# Patient Record
Sex: Male | Born: 2000
Health system: Southern US, Community
[De-identification: ages and names within clinical notes are randomized; demographics above are authoritative.]

## PROBLEM LIST (undated history)

## (undated) DIAGNOSIS — J45909 Unspecified asthma, uncomplicated: Secondary | ICD-10-CM

## (undated) HISTORY — DX: Unspecified asthma, uncomplicated: J45.909

---
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
## 2019-04-25 IMAGING — US US EXTREM  UP VENOUS*L*
1 series · 13 of 24 positions shown · non-contrast
Comparison: None.

CLINICAL DATA: Superficial thrombophlebitis. Left upper extremity
pain, swelling



[Series 1: us extrem up venous*left* · 0.06mm/px · 13 of 39 slices shown]
[im 1/39]
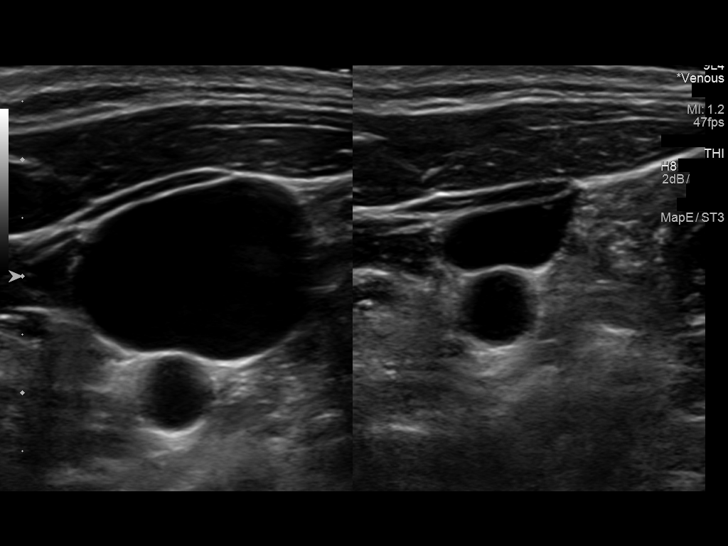
[im 4/39]
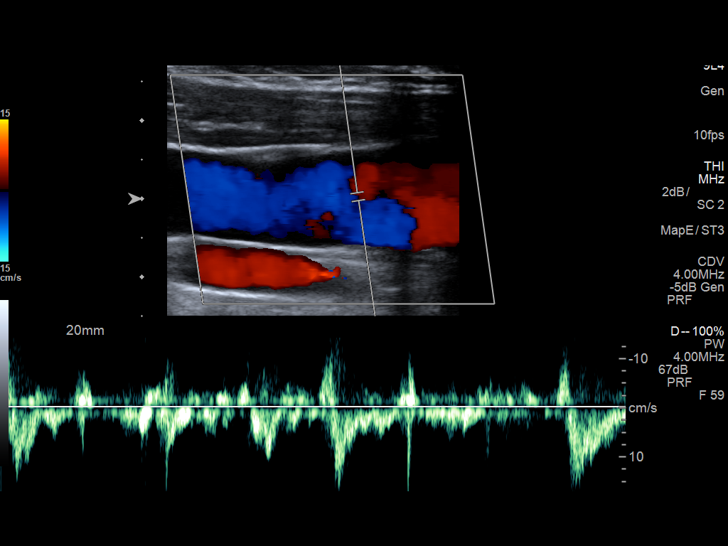
[im 7/39]
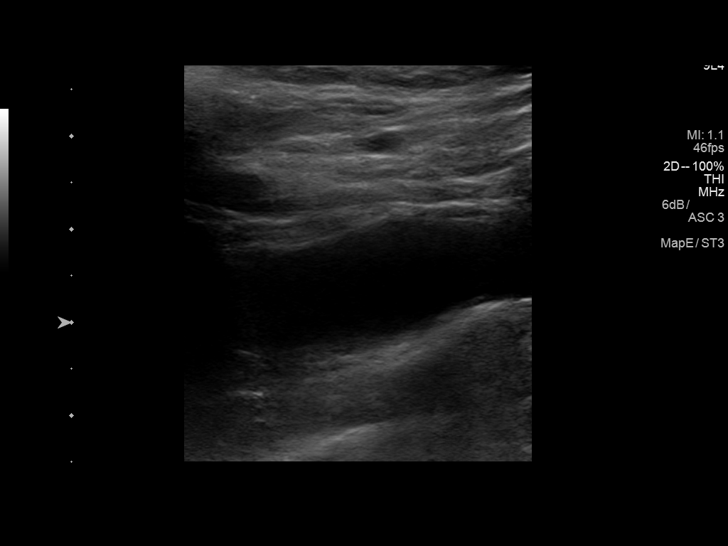
[im 10/39]
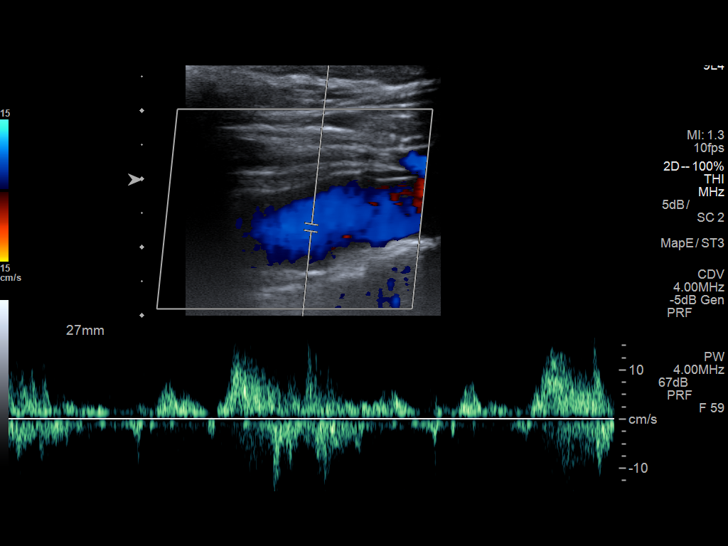
[im 14/39]
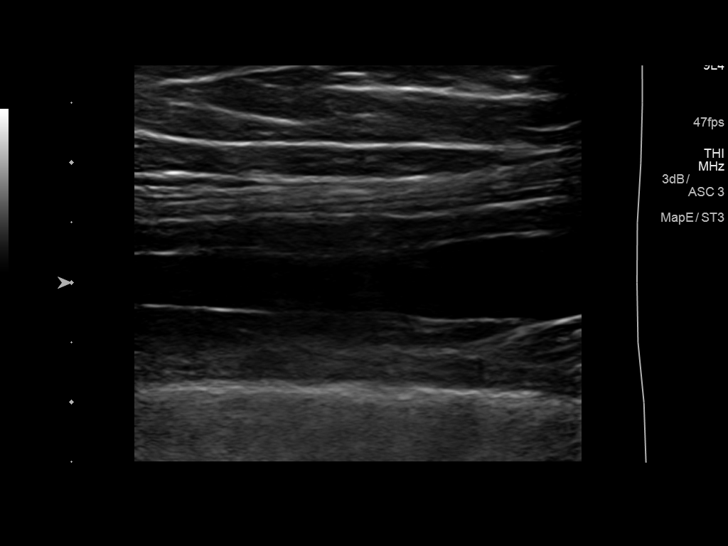
[im 17/39]
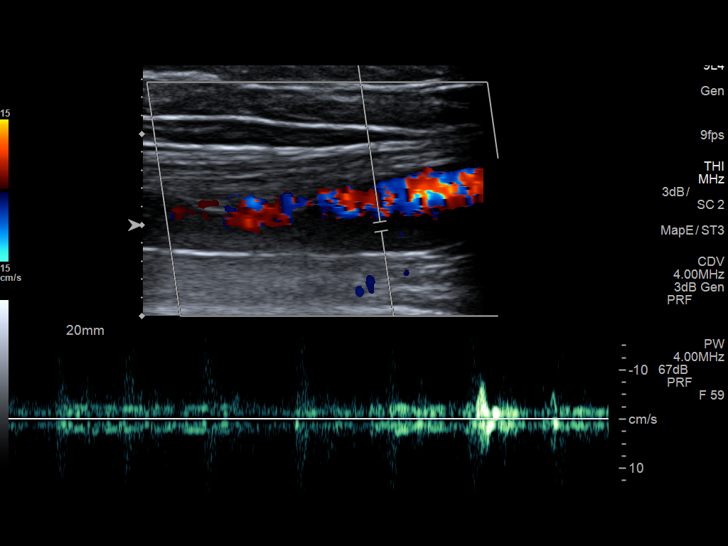
[im 20/39]
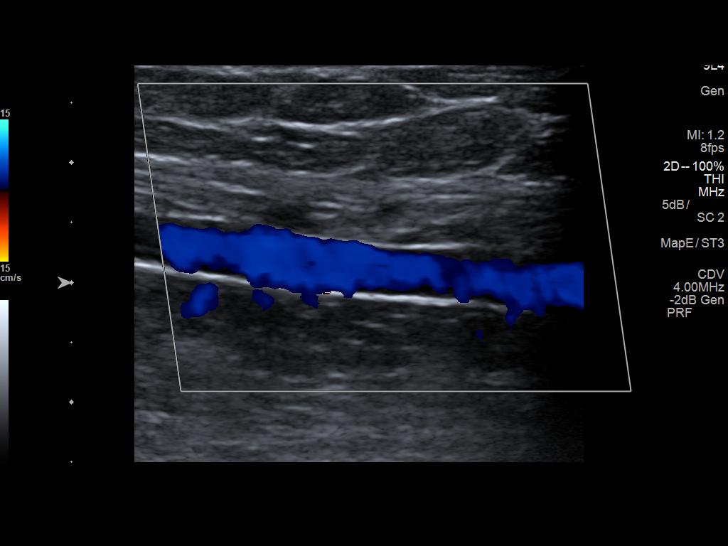
[im 22/39]
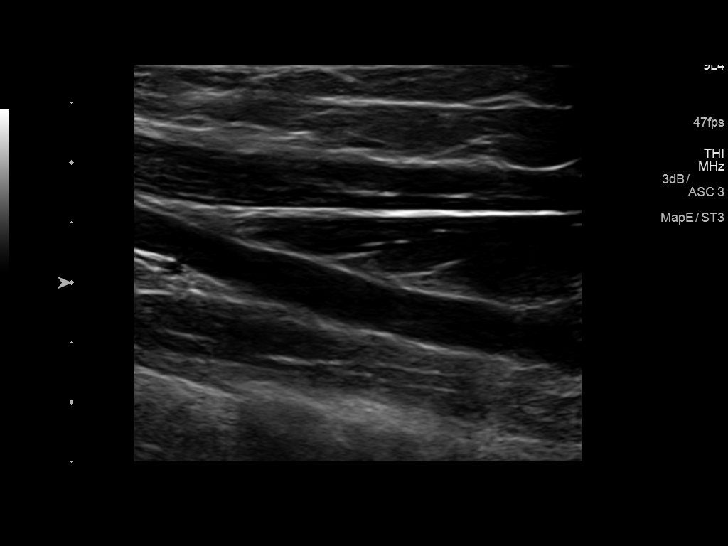
[im 25/39]
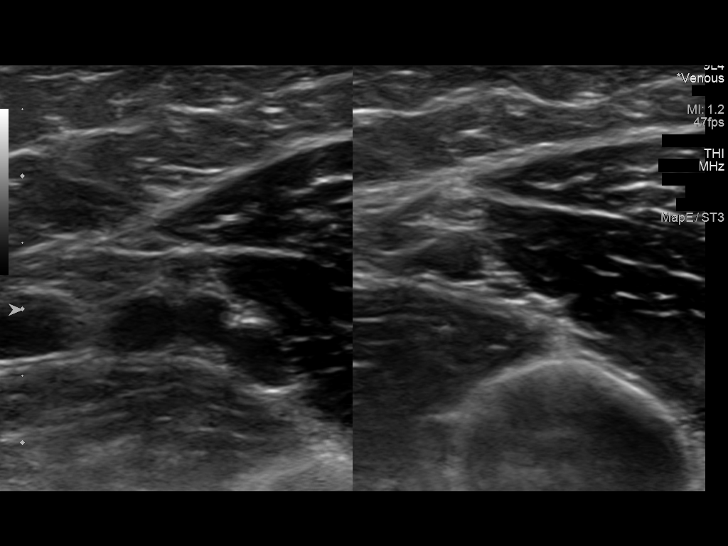
[im 29/39]
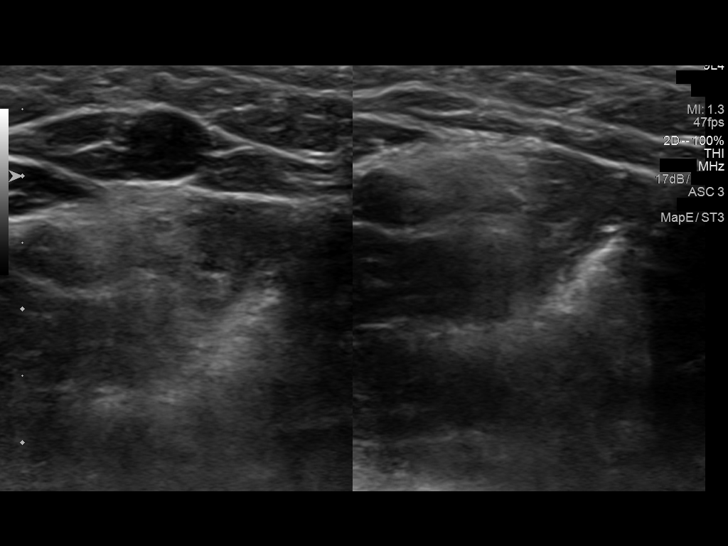
[im 32/39]
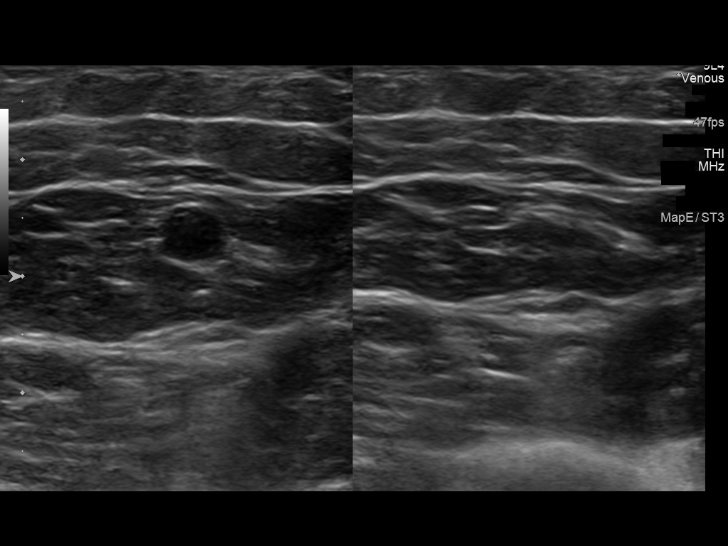
[im 35/39]
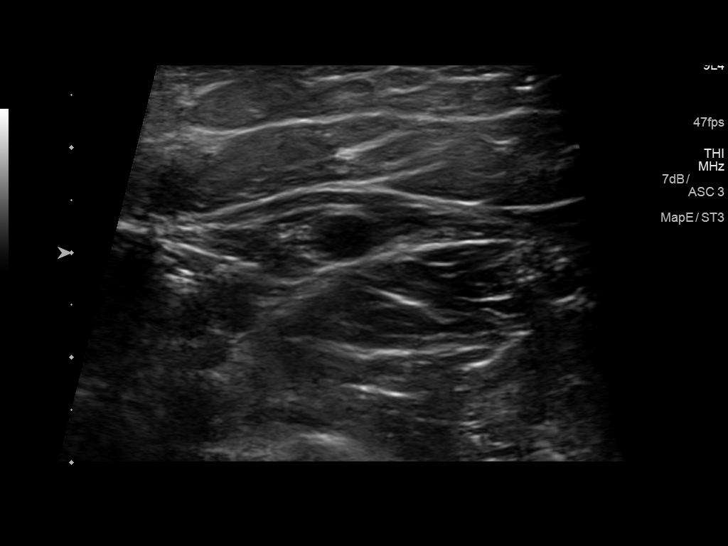
[im 39/39]
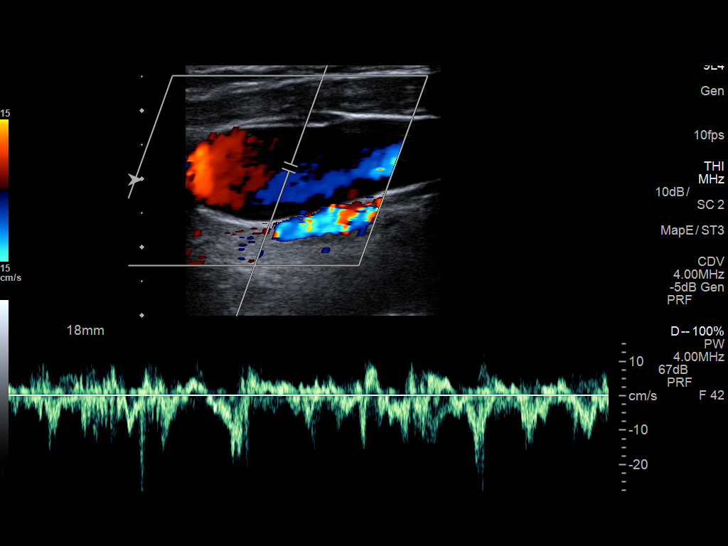

[13 of 24 positions shown; findings below may reference images not displayed]

FINDINGS: Contralateral Subclavian Vein: Respiratory phasicity is normal and
symmetric with the symptomatic side. No evidence of thrombus. Normal
compressibility.

Internal Jugular Vein: No evidence of thrombus. Normal
compressibility, respiratory phasicity and response to augmentation.

Subclavian Vein: No evidence of thrombus. Normal compressibility,
respiratory phasicity and response to augmentation.

Axillary Vein: No evidence of thrombus. Normal compressibility,
respiratory phasicity and response to augmentation.

Cephalic Vein: No evidence of thrombus. Normal compressibility,
respiratory phasicity and response to augmentation.

Basilic Vein: No evidence of thrombus. Normal compressibility,
respiratory phasicity and response to augmentation.

Brachial Veins: No evidence of thrombus. Normal compressibility,
respiratory phasicity and response to augmentation.

Radial Veins: No evidence of thrombus. Normal compressibility,
respiratory phasicity and response to augmentation.

Ulnar Veins: No evidence of thrombus. Normal compressibility,
respiratory phasicity and response to augmentation.

Venous Reflux:  None visualized.

Other Findings: No abnormality noted in the medial left upper arm in
the area of palpable lump.
IMPRESSION: No evidence of DVT within the left upper extremity.

## 2022-02-26 ENCOUNTER — Other Ambulatory Visit: Payer: Self-pay

## 2022-02-26 ENCOUNTER — Emergency Department (HOSPITAL_BASED_OUTPATIENT_CLINIC_OR_DEPARTMENT_OTHER)
Admission: EM | Admit: 2022-02-26 | Discharge: 2022-02-26 | Disposition: A | Discharge status: Home / Self Care | Payer: 59 | Attending: Emergency Medicine | Admitting: Emergency Medicine

## 2022-02-26 ENCOUNTER — Encounter (HOSPITAL_BASED_OUTPATIENT_CLINIC_OR_DEPARTMENT_OTHER): Payer: Self-pay | Admitting: Obstetrics and Gynecology

## 2022-02-26 DIAGNOSIS — J45909 Unspecified asthma, uncomplicated: Secondary | ICD-10-CM | POA: Diagnosis not present

## 2022-02-26 DIAGNOSIS — S61412A Laceration without foreign body of left hand, initial encounter: Secondary | ICD-10-CM | POA: Diagnosis not present

## 2022-02-26 DIAGNOSIS — W268XXA Contact with other sharp object(s), not elsewhere classified, initial encounter: Secondary | ICD-10-CM | POA: Insufficient documentation

## 2022-02-26 DIAGNOSIS — S6992XA Unspecified injury of left wrist, hand and finger(s), initial encounter: Secondary | ICD-10-CM | POA: Diagnosis not present

## 2022-02-26 MED ORDER — LIDOCAINE HCL (PF) 1 % IJ SOLN
10.0000 mL | Freq: Once | INTRAMUSCULAR | Status: AC
  Administered 2022-02-26: 10 mL
  Filled 2022-02-26: qty 10

## 2022-02-26 NOTE — ED Triage Notes (Signed)
Patient presents to the ER for a laceration to the right palm of the hand. Patient had a tetanus shot within the last 6 months.  ?

## 2022-02-26 NOTE — ED Provider Notes (Signed)
?[facility]PT ?Provider Note ? ? ?CSN: [id number] ?Arrival date & time: 02/26/22  1447 ? ?  ? ?History ? ?Chief Complaint  ?Patient presents with  ? Laceration  ? ? ?Scott Rubio is a 21 y.o. male with past medical history of asthma.  Presents to the emergency department with complaint of laceration to right palm.  Patient states that laceration occurred accidentally at approximately 2 PM this afternoon.  States that he was coming down a ladder at work when he cut his palm on a piece of metal attached to an air filter.  Patient reports bleeding was controlled with direct pressure.  Patient endorses minimal pain to laceration site.  Denies any numbness, weakness, color change. ? ?Patient is right-hand dominant.  Last tetanus shot within the past 6 months. ? ? ?Laceration ?Associated symptoms: no fever and no rash   ? ?  ? ?Home Medications ?Prior to Admission medications   ?Medication Sig Start Date End Date Taking? Authorizing Provider  ?sulfamethoxazole-trimethoprim (BACTRIM DS) 800-160 MG tablet Take 1 tablet by mouth 2 (two) times daily. 12/19/18   Dettinger, Elige RadonJoshua A, MD  ?sulfamethoxazole-trimethoprim (BACTRIM DS,SEPTRA DS) 800-160 MG tablet TAKE  (1)  TABLET TWICE A DAY. 12/20/18   Junie SpencerHawks, Christy A, FNP  ?   ? ?Allergies    ?Amoxicillin and Penicillins   ? ?Review of Systems   ?Review of Systems  ?Constitutional:  Negative for chills and fever.  ?Musculoskeletal:  Positive for myalgias. Negative for arthralgias.  ?Skin:  Positive for wound. Negative for color change, pallor and rash.  ?Neurological:  Negative for weakness and numbness.  ? ?Physical Exam ?Updated Vital Signs ?BP (!) 148/95 (BP Location: Left Arm)   Pulse 86   Temp 99 ?F (37.2 ?C)   Resp 16   SpO2 97%  ?Physical Exam ?Vitals and nursing note reviewed.  ?Constitutional:   ?   General: He is not in acute distress. ?   Appearance: He is not ill-appearing, toxic-appearing or diaphoretic.  ?HENT:  ?   Head: Normocephalic.   ?Eyes:  ?   General: No scleral icterus.    ?   Right eye: No discharge.     ?   Left eye: No discharge.  ?Cardiovascular:  ?   Rate and Rhythm: Normal rate.  ?   Pulses:     ?     Dorsalis pedis pulses are 2+ on the right side and 2+ on the left side.  ?Pulmonary:  ?   Effort: Pulmonary effort is normal.  ?Musculoskeletal:  ?   Right forearm: Normal.  ?   Left forearm: Normal.  ?   Right wrist: Normal.  ?   Left wrist: Normal.  ?   Right hand: Laceration present. No swelling, deformity, tenderness or bony tenderness. Normal range of motion. Normal sensation. Normal capillary refill. Normal pulse.  ?   Left hand: No swelling, deformity, lacerations, tenderness or bony tenderness. Normal range of motion. Normal sensation. Normal capillary refill. Normal pulse.  ?     Arms: ? ?   Comments: Patient has full range of motion to all digits of right hand including abduction and abduction of right thumb. ? ?1 Centimeter laceration to palm of right hand as indicated above  ?Skin: ?   General: Skin is warm and dry.  ?Neurological:  ?   General: No focal deficit present.  ?   Mental Status: He is alert.  ?Psychiatric:     ?  Behavior: Behavior is cooperative.  ? ? ?ED Results / Procedures / Treatments   ?Labs ?(all labs ordered are listed, but only abnormal results are displayed) ?Labs Reviewed - No data to display ? ?EKG ?None ? ?Radiology ?No results found. ? ?Procedures ?Marland Kitchen.Laceration Repair ? ?Date/Time: 02/26/2022 11:52 PM ?Performed by: Haskel Schroeder, PA-C ?Authorized by: Haskel Schroeder, PA-C  ? ?Consent:  ?  Consent obtained:  Verbal ?  Consent given by:  Patient ?  Risks discussed:  Infection, need for additional repair, pain, poor cosmetic result and poor wound healing ?  Alternatives discussed:  No treatment and delayed treatment ?Universal protocol:  ?  Procedure explained and questions answered to patient or proxy's satisfaction: yes   ?  Relevant documents present and verified: yes   ?  Immediately  prior to procedure, a time out was called: yes   ?  Patient identity confirmed:  Verbally with patient and arm band ?Anesthesia:  ?  Anesthesia method:  Local infiltration ?  Local anesthetic:  Lidocaine 1% w/o epi ?Laceration details:  ?  Location:  Hand ?  Hand location:  L palm ?  Length (cm):  1 ?Pre-procedure details:  ?  Preparation:  Patient was prepped and draped in usual sterile fashion ?Exploration:  ?  Wound exploration: entire depth of wound visualized   ?  Wound extent: no foreign bodies/material noted   ?Treatment:  ?  Area cleansed with:  Povidone-iodine ?  Irrigation solution:  Sterile saline ?  Irrigation method:  Syringe ?Skin repair:  ?  Repair method:  Sutures ?  Suture size:  3-0 ?  Suture material:  Prolene ?  Suture technique:  Simple interrupted ?  Number of sutures:  4 ?Repair type:  ?  Repair type:  Simple ?Post-procedure details:  ?  Dressing:  Sterile dressing ?  Procedure completion:  Tolerated well, no immediate complications  ? ? ?Medications Ordered in ED ?Medications  ?lidocaine (PF) (XYLOCAINE) 1 % injection 10 mL (10 mLs Infiltration Given 02/26/22 1701)  ? ? ?ED Course/ Medical Decision Making/ A&P ?  ?                        ?Medical Decision Making ?Risk ?Prescription drug management. ? ? ?Alert 21 year old male no acute distress, nontoxic-appearing.  Presents emergency department complaint of laceration to right palm. ? ?Information is obtained from patient and patient's brother at bedside.  Past medical records were reviewed including previous provider notes. ? ?Patient is right-hand dominant.  Tetanus shot within the last 6 months.  Patient has full range of motion to all digits of right hand including abduction and abduction of right thumb.  Low suspicion for tendon injury.  Cap refill less than 2 seconds and sensation intact to all digits of left hand. ? ?Shared decision making with patient on suture procedure versus no treatment.  Patient elects for procedure at this time.   Entire depth of wound was evaluated in clear and bloodless field.  No foreign bodies appreciated.  Suture procedure as noted above. ? ?Patient will need to have sutures removed in 10 to 14 days. ? ?Discussed results, findings, treatment and follow up. Patient advised of return precautions. Patient verbalized understanding and agreed with plan. ? ? ? ? ? ? ? ? ?Final Clinical Impression(s) / ED Diagnoses ?Final diagnoses:  ?Laceration of left palm, initial encounter  ? ? ?Rx / DC Orders ?ED Discharge Orders   ? ?  None  ? ?  ? ? ?  ?Haskel Schroeder, PA-C ?02/26/22 2354 ? ?  ?Terald Sleeper, MD ?02/27/22 [phone] ? ?

## 2022-02-26 NOTE — Discharge Instructions (Addendum)
Your laceration required stiches.  Please follow up with your primary care provider, urgent care or return to the emergency department in 10-14 days to have your stitches removed.   ° °Please keep the wound dry for the next 24 hours.  After that you may gently wash the area with soap and water.  Do not submerge the wound under water until the stiches are removed.   ° °Get help right away if: °You develop severe swelling around the wound. °Your pain suddenly increases and is severe. °You develop painful lumps near the wound or on skin anywhere else on your body. °You have a red streak going away from your wound. °The wound is on your hand or foot, and you cannot properly move a finger or toe. °The wound is on your hand or foot, and you notice that your fingers or toes look pale or bluish. °

## 2022-03-12 ENCOUNTER — Encounter (HOSPITAL_COMMUNITY): Payer: Self-pay

## 2022-03-12 ENCOUNTER — Ambulatory Visit (INDEPENDENT_AMBULATORY_CARE_PROVIDER_SITE_OTHER): Payer: 59

## 2022-03-12 ENCOUNTER — Emergency Department (HOSPITAL_COMMUNITY)
Admission: EM | Admit: 2022-03-12 | Discharge: 2022-03-12 | Disposition: A | Discharge status: Home / Self Care | Payer: 59 | Attending: Emergency Medicine | Admitting: Emergency Medicine

## 2022-03-12 ENCOUNTER — Ambulatory Visit
Admission: EM | Admit: 2022-03-12 | Discharge: 2022-03-12 | Disposition: A | Discharge status: Home / Self Care | Payer: 59 | Attending: Urgent Care | Admitting: Urgent Care

## 2022-03-12 ENCOUNTER — Encounter: Payer: Self-pay | Admitting: Emergency Medicine

## 2022-03-12 DIAGNOSIS — Z4801 Encounter for change or removal of surgical wound dressing: Secondary | ICD-10-CM | POA: Insufficient documentation

## 2022-03-12 DIAGNOSIS — L03113 Cellulitis of right upper limb: Secondary | ICD-10-CM

## 2022-03-12 DIAGNOSIS — T148XXA Other injury of unspecified body region, initial encounter: Secondary | ICD-10-CM | POA: Diagnosis not present

## 2022-03-12 DIAGNOSIS — L089 Local infection of the skin and subcutaneous tissue, unspecified: Secondary | ICD-10-CM | POA: Diagnosis not present

## 2022-03-12 DIAGNOSIS — T8149XA Infection following a procedure, other surgical site, initial encounter: Secondary | ICD-10-CM | POA: Diagnosis not present

## 2022-03-12 DIAGNOSIS — S61411A Laceration without foreign body of right hand, initial encounter: Secondary | ICD-10-CM

## 2022-03-12 LAB — CBC WITH DIFFERENTIAL/PLATELET
Abs Immature Granulocytes: 0.03 10*3/uL (ref 0.00–0.07)
Basophils Absolute: 0 10*3/uL (ref 0.0–0.1)
Basophils Relative: 0 %
Eosinophils Absolute: 0.1 10*3/uL (ref 0.0–0.5)
Eosinophils Relative: 1 %
HCT: 46.7 % (ref 39.0–52.0)
Hemoglobin: 15.9 g/dL (ref 13.0–17.0)
Immature Granulocytes: 0 %
Lymphocytes Relative: 18 %
Lymphs Abs: 1.7 10*3/uL (ref 0.7–4.0)
MCH: 31.2 pg (ref 26.0–34.0)
MCHC: 34 g/dL (ref 30.0–36.0)
MCV: 91.6 fL (ref 80.0–100.0)
Monocytes Absolute: 0.8 10*3/uL (ref 0.1–1.0)
Monocytes Relative: 8 %
Neutro Abs: 7.1 10*3/uL (ref 1.7–7.7)
Neutrophils Relative %: 73 %
Platelets: 229 10*3/uL (ref 150–400)
RBC: 5.1 MIL/uL (ref 4.22–5.81)
RDW: 12.3 % (ref 11.5–15.5)
WBC: 9.7 10*3/uL (ref 4.0–10.5)
nRBC: 0 % (ref 0.0–0.2)

## 2022-03-12 LAB — BASIC METABOLIC PANEL
Anion gap: 7 (ref 5–15)
BUN: 13 mg/dL (ref 6–20)
CO2: 24 mmol/L (ref 22–32)
Calcium: 9.6 mg/dL (ref 8.9–10.3)
Chloride: 108 mmol/L (ref 98–111)
Creatinine, Ser: 0.82 mg/dL (ref 0.61–1.24)
GFR, Estimated: >60 mL/min (ref >60)
Glucose, Bld: 94 mg/dL (ref 70–99)
Potassium: 3.9 mmol/L (ref 3.5–5.1)
Sodium: 139 mmol/L (ref 135–145)

## 2022-03-12 LAB — LACTIC ACID, PLASMA: Lactic Acid, Venous: 0.9 mmol/L (ref 0.5–1.9)

## 2022-03-12 MED ORDER — DOXYCYCLINE HYCLATE 100 MG PO CAPS: 100.0000 mg | ORAL_CAPSULE | Freq: Two times a day (BID) | ORAL | 0 refills | Status: DC

## 2022-03-12 NOTE — ED Provider Notes (Addendum)
?[facility] ? ? ?MRN: [id number] DOB: 09-01-2001 ? ?Subjective:  ? ?Scott Rubio is a 21 y.o. male presenting for recheck on a wound from the right hand.  Injury was sustained 2 weeks ago.  He has a laceration repair at the emergency room.  Has tried to keep his hand clean and has practiced good precautionary measures including wearing protection over his hand while at work.  In the past 2 days he has had significant pain and redness, swelling.  No fever. ? ?No current facility-administered medications for this encounter. ? ?Current Outpatient Medications:  ?  sulfamethoxazole-trimethoprim (BACTRIM DS) 800-160 MG tablet, Take 1 tablet by mouth 2 (two) times daily., Disp: 14 tablet, Rfl: 0 ?  sulfamethoxazole-trimethoprim (BACTRIM DS,SEPTRA DS) 800-160 MG tablet, TAKE  (1)  TABLET TWICE A DAY., Disp: 14 tablet, Rfl: 0  ? ?Allergies  ?Allergen Reactions  ? Amoxicillin   ? Penicillins Rash  ? ? ?Past Medical History:  ?Diagnosis Date  ? Asthma   ?  ? ?History reviewed. No pertinent surgical history. ? ?Family History  ?Problem Relation Age of Onset  ? Heart disease Father   ? Hypertension Father   ? ? ?Social History  ? ?Tobacco Use  ? Smoking status: Never  ?  Passive exposure: Never  ? Smokeless tobacco: Never  ?Vaping Use  ? Vaping Use: Never used  ?Substance Use Topics  ? Alcohol use: No  ? Drug use: No  ? ? ?ROS ? ? ?Objective:  ? ?Vitals: ?BP 139/84 (BP Location: Right Arm)   Pulse 82   Temp 98.5 ?F (36.9 ?C) (Oral)   Resp 18   SpO2 94%  ? ?Physical Exam ?Constitutional:   ?   General: He is not in acute distress. ?   Appearance: Normal appearance. He is well-developed and normal weight. He is not ill-appearing, toxic-appearing or diaphoretic.  ?HENT:  ?   Head: Normocephalic and atraumatic.  ?   Right Ear: External ear normal.  ?   Left Ear: External ear normal.  ?   Nose: Nose normal.  ?   Mouth/Throat:  ?   Pharynx: Oropharynx is clear.  ?Eyes:  ?   General: No scleral icterus.    ?    Right eye: No discharge.     ?   Left eye: No discharge.  ?   Extraocular Movements: Extraocular movements intact.  ?Cardiovascular:  ?   Rate and Rhythm: Normal rate.  ?Pulmonary:  ?   Effort: Pulmonary effort is normal.  ?Musculoskeletal:  ?     Hands: ? ?   Cervical back: Normal range of motion.  ?Neurological:  ?   Mental Status: He is alert and oriented to person, place, and time.  ?Psychiatric:     ?   Mood and Affect: Mood normal.     ?   Behavior: Behavior normal.     ?   Thought Content: Thought content normal.     ?   Judgment: Judgment normal.  ? ? ? ? ?DG Hand Complete Right ? ?Result Date: 03/12/2022 ?CLINICAL DATA:  Laceration to first metacarpal, rule out deep space infection EXAM: RIGHT HAND - COMPLETE 3+ VIEW COMPARISON:  None. FINDINGS: There is no evidence of fracture or dislocation. There is no evidence of arthropathy or other focal bone abnormality. Diffuse soft tissue edema about the hand. No subcutaneous emphysema. IMPRESSION: 1. No fracture or dislocation of the right hand. 2. Diffuse soft tissue edema about the hand. No  subcutaneous emphysema. Please note that the absence of soft tissue gas does not exclude deep tissue infection; recommend urgent surgical referral if there is high clinical suspicion for deep tissue infection. 3. No radiopaque foreign body. Electronically Signed   By: Jearld Lesch M.D.   On: 03/12/2022 09:49   ? ? ?Assessment and Plan :  ? ?PDMP not reviewed this encounter. ? ?1. Infected wound   ?2. Cellulitis of right hand   ? ?I attempted to secure an appointment with Dr. Debby Bud practice as this practice is listed as the Am[facility] provider on call but was told by patient access staff that it would be "impossible for him to be seen today". Will have the call the patient call directly to see him for further evaluation and rule out of deep tissue infection of his hand. In the meantime, I did prescribe doxycycline to help with his wound infection. Use naproxen for pain and  inflammation. Maintain strict ER precautions if he is unable to secure an appointment with Dr. Debby Bud practice.  ?  ?Wallis Bamberg, PA-C ?03/12/22 1017 ? ?

## 2022-03-12 NOTE — Discharge Instructions (Addendum)
I provided the hand specialist on call with your phone number. They are the providers on call and should be able to see you today at some point. They will reach out to you for an appointment.  In the meantime, please start doxycycline.  This can help with the wound infection.  Please keep the area covered. ?

## 2022-03-12 NOTE — Discharge Instructions (Addendum)
As we discussed today it is important that you are cleaning your wound. ?Your wound should be good for today. ? ?You have been given 2 bottles of saline.  3 times a day (once in the morning, once midday, and once at night) please use about a third of the bottle to rinse out your wound trying to make sure you are spreading it open to rinse it well. ? ?Additionally for the first day prior to using the bottle of saline please use about half hydrogen peroxide and half clean tap water to rinse this area out and then use the bottle of saline/water that you were given. ? ?Also for the first day if the packing comes out while you are rinsing it out that is okay, as we discussed please try to put a new piece of packing in leaving tails to ensure that it does not get healed and closed over.  This is to try and help keep the wound open so that it can continue to drain. ? ?If anything happens to the bottles it is ok to use either boiled and then cooled water instead.  ? ?After 2 days please return to the emergency room for a wound check. ?If at any point you develop fevers, or have any new or concerning symptoms please seek additional medical care and evaluation. ? ? ? ? ? ?You may have diarrhea from the antibiotics.  It is very important that you continue to take the antibiotics even if you get diarrhea unless a medical professional tells you that you may stop taking them.  If you stop too early the bacteria you are being treated for will become stronger and you may need different, more powerful antibiotics that have more side effects and worsening diarrhea.  Please stay well hydrated and consider probiotics as they may decrease the severity of your diarrhea.  ? ?Please take Ibuprofen (Advil, motrin) and Tylenol (acetaminophen) to relieve your pain.   ? ?You may take up to 600 MG (3 pills) of normal strength ibuprofen every 8 hours as needed.   ?You make take tylenol, up to 1,000 mg (two extra strength pills) every 8 hours as  needed.  ? ?It is safe to take ibuprofen and tylenol at the same time as they work differently.  ? Do not take more than 3,000 mg tylenol in a 24 hour period (not more than one dose every 8 hours.  Please check all medication labels as many medications such as pain and cold medications may contain tylenol.  Do not drink alcohol while taking these medications.  Do not take other NSAID'S while taking ibuprofen (such as aleve or naproxen).  Please take ibuprofen with food to decrease stomach upset. ? ? ?

## 2022-03-12 NOTE — ED Notes (Signed)
PA at bedside.

## 2022-03-12 NOTE — ED Provider Triage Note (Signed)
Emergency Medicine Provider Triage Evaluation Note ? ?Scott Rubio , a 21 y.o. male  was evaluated in triage.  Pt complains of wound check onset today. Cut his hand on an air filter 2 weeks ago and had it repaired in the ED.  Has associated redness, swelling. Spoke with Dr. Izora Ribas today and was told to come to the ED for evaluation.  ? ?Review of Systems  ?Positive: As per HPI above ?Negative: ? ?Physical Exam  ?BP (!) 148/104 (BP Location: Right Arm)   Pulse 83   Temp 98.4 ?F (36.9 ?C) (Oral)   Resp 16   Ht 5\' 11"  (1.803 m)   Wt 127 kg   SpO2 97%   BMI 39.05 kg/m?  ?Gen:   Awake, no distress   ?Resp:  Normal effort  ?MSK:   Moves extremities without difficulty  ?Other:  Redness and swelling noted to thenar eminence of right hand.  Decreased finger to thumb opposition secondary to swelling thenar eminence.  Decreased flexion of right thumb secondary to pain and swelling. ? ?Medical Decision Making  ?Medically screening exam initiated at 3:11 PM.  Appropriate orders placed.  Scott Rubio was informed that the remainder of the evaluation will be completed by another provider, this initial triage assessment does not replace that evaluation, and the importance of remaining in the ED until their evaluation is complete. ? ?  ?Scott Rubio A, PA-C ?03/12/22 1516 ? ?

## 2022-03-12 NOTE — ED Triage Notes (Signed)
Pt referred from UC due to possible infection in L hand. Pt initially injured the area two weeks ago and presented today for suture removal. Pt notes increased swelling, warmth, redness, pain. Pt denies fevers. Pt given prescription for doxycycline 100 mg BID. First dose earlier this morning.  ?

## 2022-03-12 NOTE — ED Provider Notes (Signed)
?[facility] ?Provider Note ? ? ?CSN: [id number] ?Arrival date & time: 03/12/22  1438 ? ?  ? ?History ? ?Chief Complaint  ?Patient presents with  ? Wound Check  ? ? ?Scott Rubio is a 21 y.o. male with no significant past medical history who presents today for evaluation of a right hand wound. ?On 02/26/2022 he sustained a injury to his right hand which was sutured. ?Today he went to urgent care and he states that when the last suture was removed a large amount of purulent material spontaneously began draining from the wound.  He states that his wound had been doing fine and he did not have any pain after the second day until 2 days ago when he felt the area started to get swollen, red, and hot with increasing pain.  He denies any fevers and otherwise feels well.  He states his tetanus is up-to-date. ? ?On review of note from urgent care today it appears they tried to call Dr. Izora Ribasoley of hand surgery to obtain close outpatient follow-up and were unsuccessful. ?HPI ? ?  ? ?Home Medications ?Prior to Admission medications   ?Medication Sig Start Date End Date Taking? Authorizing Provider  ?doxycycline (VIBRAMYCIN) 100 MG capsule Take 1 capsule (100 mg total) by mouth 2 (two) times daily. 03/12/22   Wallis BambergMani, Mario, PA-C  ?sulfamethoxazole-trimethoprim (BACTRIM DS) 800-160 MG tablet Take 1 tablet by mouth 2 (two) times daily. 12/19/18   Dettinger, Elige RadonJoshua A, MD  ?sulfamethoxazole-trimethoprim (BACTRIM DS,SEPTRA DS) 800-160 MG tablet TAKE  (1)  TABLET TWICE A DAY. 12/20/18   Junie SpencerHawks, Christy A, FNP  ?   ? ?Allergies    ?Amoxicillin and Penicillins   ? ?Review of Systems   ?Review of Systems ? ?Physical Exam ?Updated Vital Signs ?BP (!) 146/75   Pulse 80   Temp 98.4 ?F (36.9 ?C) (Oral)   Resp 16   Ht 5\' 11"  (1.803 m)   Wt 127 kg   SpO2 97%   BMI 39.05 kg/m?  ?Physical Exam ?Vitals and nursing note reviewed.  ?Constitutional:   ?   General: He is not in acute distress. ?HENT:  ?   Head:  Normocephalic and atraumatic.  ?Cardiovascular:  ?   Rate and Rhythm: Normal rate.  ?Pulmonary:  ?   Effort: Pulmonary effort is normal. No respiratory distress.  ?Musculoskeletal:  ?   Cervical back: No rigidity.  ?   Comments: Right hand with edema over the thenar eminence.  Patient has full range of motion of fingers on right hand.  He has full range of motion of right thumb except for with aDDuction he has pain.    ?Skin: ?   General: Skin is warm and dry.  ?   Comments: See clinical image obtained earlier today at urgent care.  There is a moderate amount of erythema around the incisional site.  I am unable to express any additional purulent material, and the area is not indurated or abnormally fluctuant and is generally nontender to palpation. ?Wound is fully dehisced, probing with sterile Q-tip there is a small cavity under the medial aspect of the wound probes less than 1cm deep.   ?Neurological:  ?   Mental Status: He is alert. Mental status is at baseline.  ?   Sensory: No sensory deficit.  ?   Comments: Awake and alert, answers all questions appropriately.  Speech is not slurred.    ?Psychiatric:     ?   Mood  and Affect: Mood normal.  ? ? ? ?ED Results / Procedures / Treatments   ?Labs ?(all labs ordered are listed, but only abnormal results are displayed) ?Labs Reviewed  ?CULTURE, BLOOD (ROUTINE X 2)  ?CULTURE, BLOOD (ROUTINE X 2)  ?BASIC METABOLIC PANEL  ?CBC WITH DIFFERENTIAL/PLATELET  ?LACTIC ACID, PLASMA  ? ? ?EKG ?None ? ?Radiology ?DG Hand Complete Right ? ?Result Date: 03/12/2022 ?CLINICAL DATA:  Laceration to first metacarpal, rule out deep space infection EXAM: RIGHT HAND - COMPLETE 3+ VIEW COMPARISON:  None. FINDINGS: There is no evidence of fracture or dislocation. There is no evidence of arthropathy or other focal bone abnormality. Diffuse soft tissue edema about the hand. No subcutaneous emphysema. IMPRESSION: 1. No fracture or dislocation of the right hand. 2. Diffuse soft tissue edema about  the hand. No subcutaneous emphysema. Please note that the absence of soft tissue gas does not exclude deep tissue infection; recommend urgent surgical referral if there is high clinical suspicion for deep tissue infection. 3. No radiopaque foreign body. Electronically Signed   By: Jearld Lesch M.D.   On: 03/12/2022 09:49   ? ?Procedures ?Irrigation ? ?Date/Time: 03/12/2022 8:33 PM ?Performed by: Cristina Gong, PA-C ?Authorized by: Cristina Gong, PA-C  ?Consent: Verbal consent obtained. ?Risks and benefits: risks, benefits and alternatives were discussed ?Consent given by: patient ?Patient understanding: patient states understanding of the procedure being performed ?Local anesthesia used: no (Offered, patient declined) ? ?Anesthesia: ?Local anesthesia used: no (Offered, patient declined) ? ?Sedation: ?Patient sedated: no ? ?Patient tolerance: patient tolerated the procedure well with no immediate complications ?Comments: After speaking with on-call hand specialist Dr. Izora Ribas was initially irrigated with dilute hydrogen peroxide, followed by over a liter of NS without additional purulence able to be expressed. ?A single strip of iodoform gauze is placed into the wound near the medial aspect where it is slightly deeper and along the edges of the wound to attempt to hold the wound edges open. ? ?  ? ? ?Medications Ordered in ED ?Medications - No data to display ? ?ED Course/ Medical Decision Making/ A&P ?  ?                        ?Medical Decision Making ?Patient is a healthy 21 year old man who presents today for concern of a wound infection.  He had sutures placed on 02/26/2022 to a wound on his right hand and had been doing well until 2 days ago when he began having increasing pain and swelling, and when he had his sutures removed earlier today at urgent care a large amount of purulent material was expressed.  Since then he has been feeling much better pain wise. ?He is afebrile, not tachycardic or  tachypneic and generally feels well.  No evidence of sepsis.  His white count is 1.7 and he is not anemic.  BMP is unremarkable.  Blood cultures were sent, his lactic acid is not elevated. ?Patient had only had 1 dose of doxycycline/any p.o. antibiotic and that was earlier this morning after the sutures were removed and therefore he has not yet failed outpatient therapy. ?On my exam he does have edema, however with gentle manipulation the wound edges are fully exposed, he does have a small area where it appears to be slightly deeper towards the medial aspect however I am unable to express additional purulent material, he does not have any abnormal induration or fluctuance that would be consistent with additional abscess. ?I  spoke with Dr. Izora Ribas, on-call for hand surgery, please see below for further details. ?Given that at this time there does not appear to be a additional drainable fluid collection, patient is neurovascularly intact without significant pain around the wound I have a very low suspicion for deep spread of infection at this time. ?Patient does not have any significant medical comorbidities. ?Wound was extensively irrigated in the emergency room and was debrided with diluted hydrogen peroxide per hand surgery recommendations. ?Patient is instructed to continue taking his antibiotics. ?I offered him a prescription for pain medications which he refused. ?He is instructed to return to the emergency room in 48 hours for wound recheck or sooner if any concerns or complications. ?Patient is given extensive instructions both orally and written for wound care at home regarding continued irrigation and recommended use of hydrogen peroxide by hand specialist and for packing and is able to demonstrate his understanding of these instructions. ? ?Return precautions were discussed with patient who states their understanding.  At the time of discharge patient denied any unaddressed complaints or concerns.  Patient is  agreeable for discharge home. ? ?Note: Portions of this report may have been transcribed using voice recognition software. Every effort was made to ensure accuracy; however, inadvertent computerized transcription error

## 2022-03-12 NOTE — ED Notes (Signed)
After taking out the 3 sutures, area started to drain pus. ?

## 2022-03-12 NOTE — ED Triage Notes (Signed)
Here for suture removal, stiches in right palm.  Thinks area may be infected.  Area red and swollen.  States he noticed a raised whitish yellow area around one of the sutures. ?

## 2022-03-15 ENCOUNTER — Other Ambulatory Visit: Payer: Self-pay

## 2022-03-15 ENCOUNTER — Encounter (HOSPITAL_COMMUNITY): Payer: Self-pay | Admitting: Emergency Medicine

## 2022-03-15 ENCOUNTER — Emergency Department (HOSPITAL_COMMUNITY)
Admission: EM | Admit: 2022-03-15 | Discharge: 2022-03-15 | Disposition: A | Discharge status: Home / Self Care | Payer: 59 | Attending: Emergency Medicine | Admitting: Emergency Medicine

## 2022-03-15 DIAGNOSIS — Z4800 Encounter for change or removal of nonsurgical wound dressing: Secondary | ICD-10-CM | POA: Insufficient documentation

## 2022-03-15 DIAGNOSIS — L089 Local infection of the skin and subcutaneous tissue, unspecified: Secondary | ICD-10-CM | POA: Diagnosis not present

## 2022-03-15 DIAGNOSIS — Z5189 Encounter for other specified aftercare: Secondary | ICD-10-CM

## 2022-03-15 DIAGNOSIS — Z4801 Encounter for change or removal of surgical wound dressing: Secondary | ICD-10-CM | POA: Diagnosis not present

## 2022-03-15 NOTE — ED Triage Notes (Addendum)
States he was told to return in 2 days to have R hand wound rechecked.  Denies pain, fever, or chills.  Dressing in place. ?

## 2022-03-15 NOTE — Discharge Instructions (Addendum)
Continue to take the doxycycline as prescribed and perform good wound care 2-3 times a day, rinsing with mild soap and water.  If you have any further concerns about the way the wound is healing, follow-up with Dr. Debby Bud office or return to the emergency department if you have acutely worsening symptoms such as increasing wound redness or warmth, pain, increasing pus draining from the wound. ? ?You may leave the packing out at this time and keep the wound covered.  ?

## 2022-03-15 NOTE — ED Provider Notes (Addendum)
?[facility] EMERGENCY DEPARTMENT ?Provider Note ? ? ?CSN: [id number] ?Arrival date & time: 03/15/22  0759 ? ?  ? ?History ? ?Chief Complaint  ?Patient presents with  ? Wound Check  ? ? ?Scott Rubio is a 21 y.o. male. ? ?Patient presents to the emergency department today for reevaluation of wound infection.  Patient was seen 2 days ago and told to return for recheck.  Patient developed an infection and wound that was previously sutured last month.  Patient has been repacking and redressing the wound at home.  He is on doxycycline which is he taking twice a day reliably.  No significant increase in pain.  Wound redness is gradually improving.  No fevers. ? ? ?  ? ?Home Medications ?Prior to Admission medications   ?Medication Sig Start Date End Date Taking? Authorizing Provider  ?doxycycline (VIBRAMYCIN) 100 MG capsule Take 1 capsule (100 mg total) by mouth 2 (two) times daily. 03/12/22   Wallis Bamberg, PA-C  ?sulfamethoxazole-trimethoprim (BACTRIM DS) 800-160 MG tablet Take 1 tablet by mouth 2 (two) times daily. 12/19/18   Dettinger, Elige Radon, MD  ?sulfamethoxazole-trimethoprim (BACTRIM DS,SEPTRA DS) 800-160 MG tablet TAKE  (1)  TABLET TWICE A DAY. 12/20/18   Junie Spencer, FNP  ?   ? ?Allergies    ?Amoxicillin and Penicillins   ? ?Review of Systems   ?Review of Systems ? ?Physical Exam ?Updated Vital Signs ?BP (!) 156/98 (BP Location: Right Arm)   Pulse 73   Temp 98.1 ?F (36.7 ?C) (Oral)   Resp 16   SpO2 100%  ?Physical Exam ?Vitals and nursing note reviewed.  ?Constitutional:   ?   Appearance: He is well-developed.  ?HENT:  ?   Head: Normocephalic and atraumatic.  ?Eyes:  ?   Conjunctiva/sclera: Conjunctivae normal.  ?Pulmonary:  ?   Effort: No respiratory distress.  ?Musculoskeletal:  ?   Cervical back: Normal range of motion and neck supple.  ?   Comments: R palmar wound open ulnarly. Minimal yellow drainage on packing.  Mild erythema associated with the wound.  Full ROM R thumb, R index  finger, R long finger.   ?Skin: ?   General: Skin is warm and dry.  ?Neurological:  ?   Mental Status: He is alert.  ? ? ? ? ? ? ?ED Results / Procedures / Treatments   ?Labs ?(all labs ordered are listed, but only abnormal results are displayed) ?Labs Reviewed - No data to display ? ?EKG ?None ? ?Radiology ?No results found. ? ?Procedures ?Procedures  ? ? ?Medications Ordered in ED ?Medications - No data to display ? ?ED Course/ Medical Decision Making/ A&P ?  ? ?Patient seen and examined. History obtained directly from patient and previous emergency department visit.  ? ?Labs/EKG: None ordered ?Imaging: None ordered ?Medications/Fluids: None ordered ? ?Most recent vital signs reviewed and are as follows: ?BP (!) 156/98 (BP Location: Right Arm)   Pulse 73   Temp 98.1 ?F (36.7 ?C) (Oral)   Resp 16   SpO2 100%  ? ?Initial impression: Right hand wound infection, healing appropriately, no significant worsening at time of recheck. ? ?Home treatment plan: Packing removed, may leave out at this point as wound is open on its own, will need protected while wound is open and patient instructed on good wound care and dressing changes twice a day.  Continue doxycycline.  Provided hand surgery information for follow-up if needed.  ? ?Return instructions discussed with patient: Pt urged to  return with worsening pain, worsening swelling, expanding area of redness or streaking up extremity, fever, or any other concerns. Urged to take complete course of antibiotics as prescribed.  Pt verbalizes understanding and agrees with plan. ? ?                        ?Medical Decision Making ? ?Patient with recently sutured wound with late infection.  Currently being treated.  Wound is open and able to drain appropriately.  There is some associated erythema but does not appear appreciably worse than previous visit.  Indication for change in care plan for emergent orthopedic involvement today.  Patient will continue wound care and  antibiotics at home.  He seems reliable to return if symptoms do worsen. ? ?The patient's vital signs, pertinent lab work and imaging were reviewed and interpreted as discussed in the ED course. Hospitalization was considered for further testing, treatments, or serial exams/observation. However as patient is well-appearing, has a stable exam, and reassuring studies today, I do not feel that they warrant admission at this time. This plan was discussed with the patient who verbalizes agreement and comfort with this plan and seems reliable and able to return to the Emergency Department with worsening or changing symptoms.  ? ? ? ? ? ? ? ? ?Final Clinical Impression(s) / ED Diagnoses ?Final diagnoses:  ?Visit for wound check  ?Wound infection  ? ? ?Rx / DC Orders ?ED Discharge Orders   ? ? None  ? ?  ? ? ? ?  ?Renne Crigler, PA-C ?03/15/22 1010 ? ?  ?Alvira Monday, MD ?03/16/22 [phone] ? ?

## 2022-03-15 NOTE — ED Notes (Signed)
Patient wound covered with gauze. Discharge instructions reviewed, denies any needs or questions  ?

## 2022-03-17 LAB — CULTURE, BLOOD (ROUTINE X 2)
Culture: NO GROWTH
Culture: NO GROWTH
Special Requests: ADEQUATE

## 2022-12-16 IMAGING — DX DG HAND COMPLETE 3+V*R*
3 series · 3 of 3 positions shown · non-contrast
Comparison: None.

CLINICAL DATA: Laceration to first metacarpal, rule out deep space
infection

EXAM:
RIGHT HAND - COMPLETE 3+ VIEW

[hand pa]
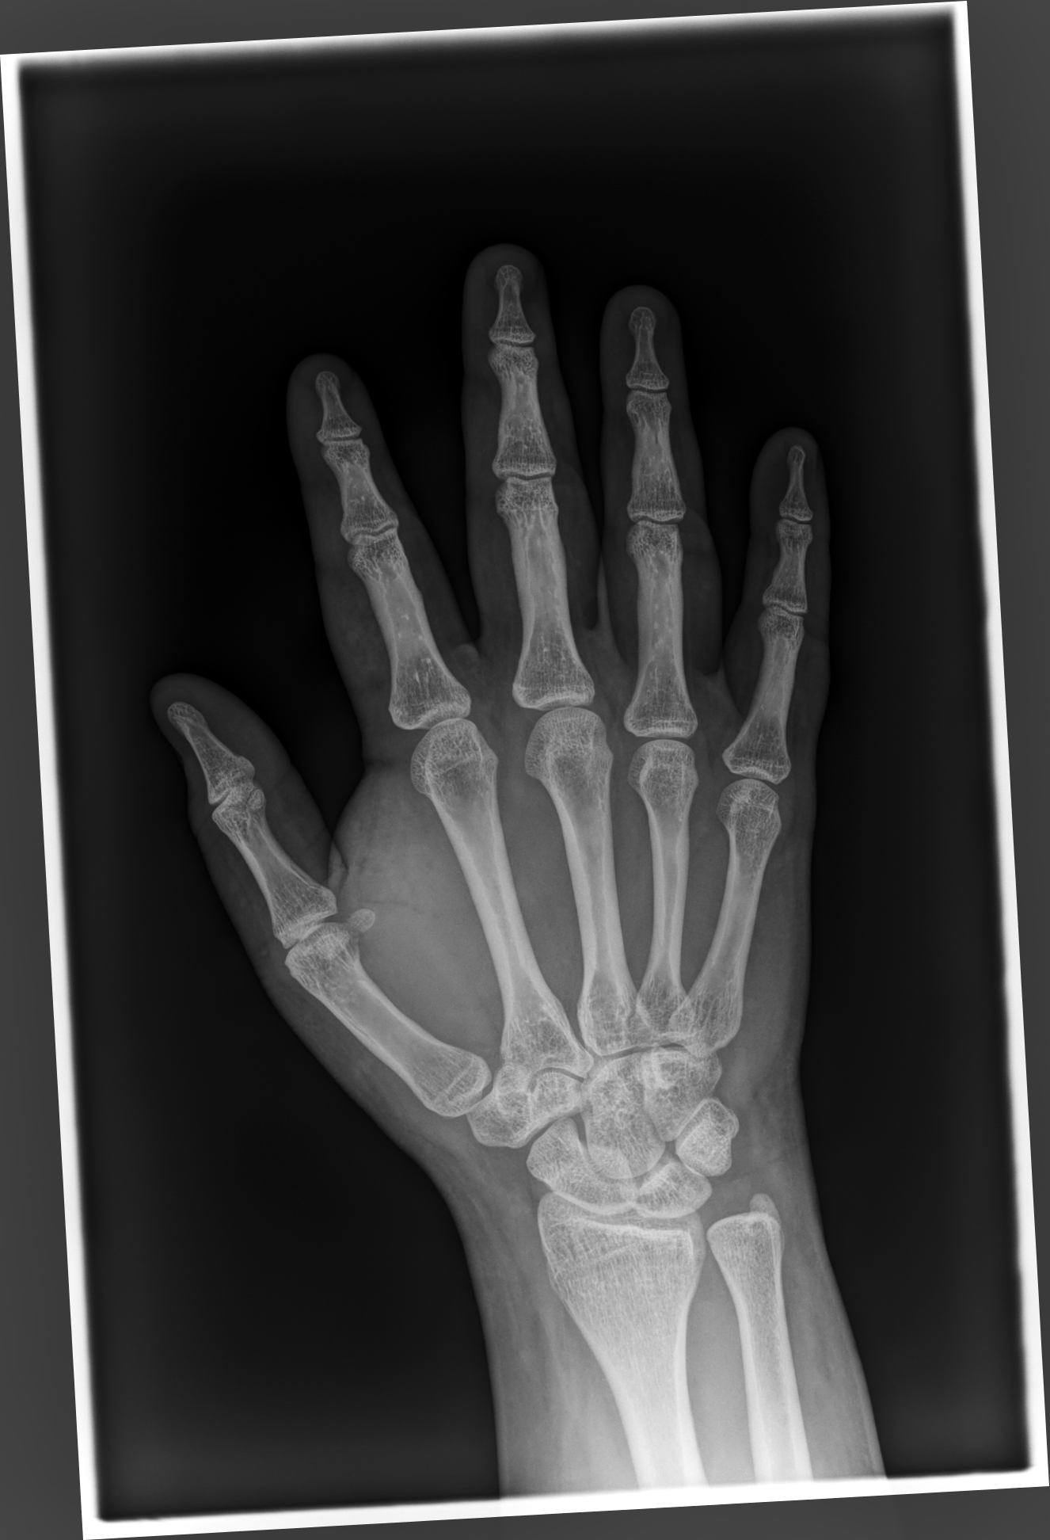

[hand mlo]
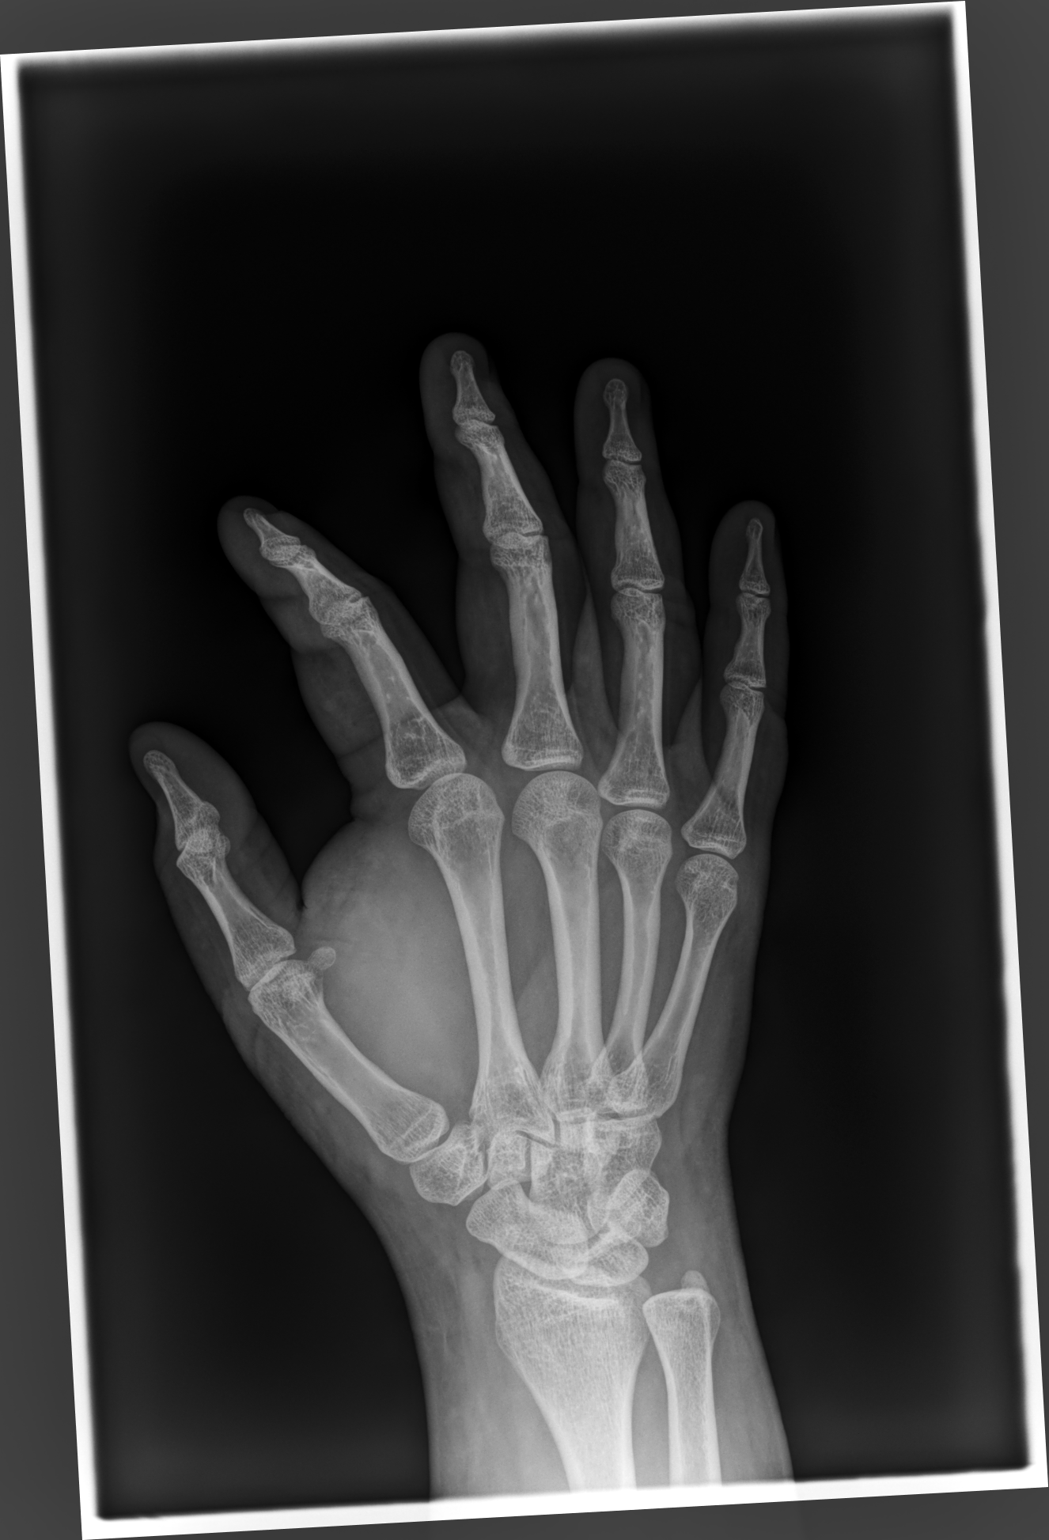

[hand lat]
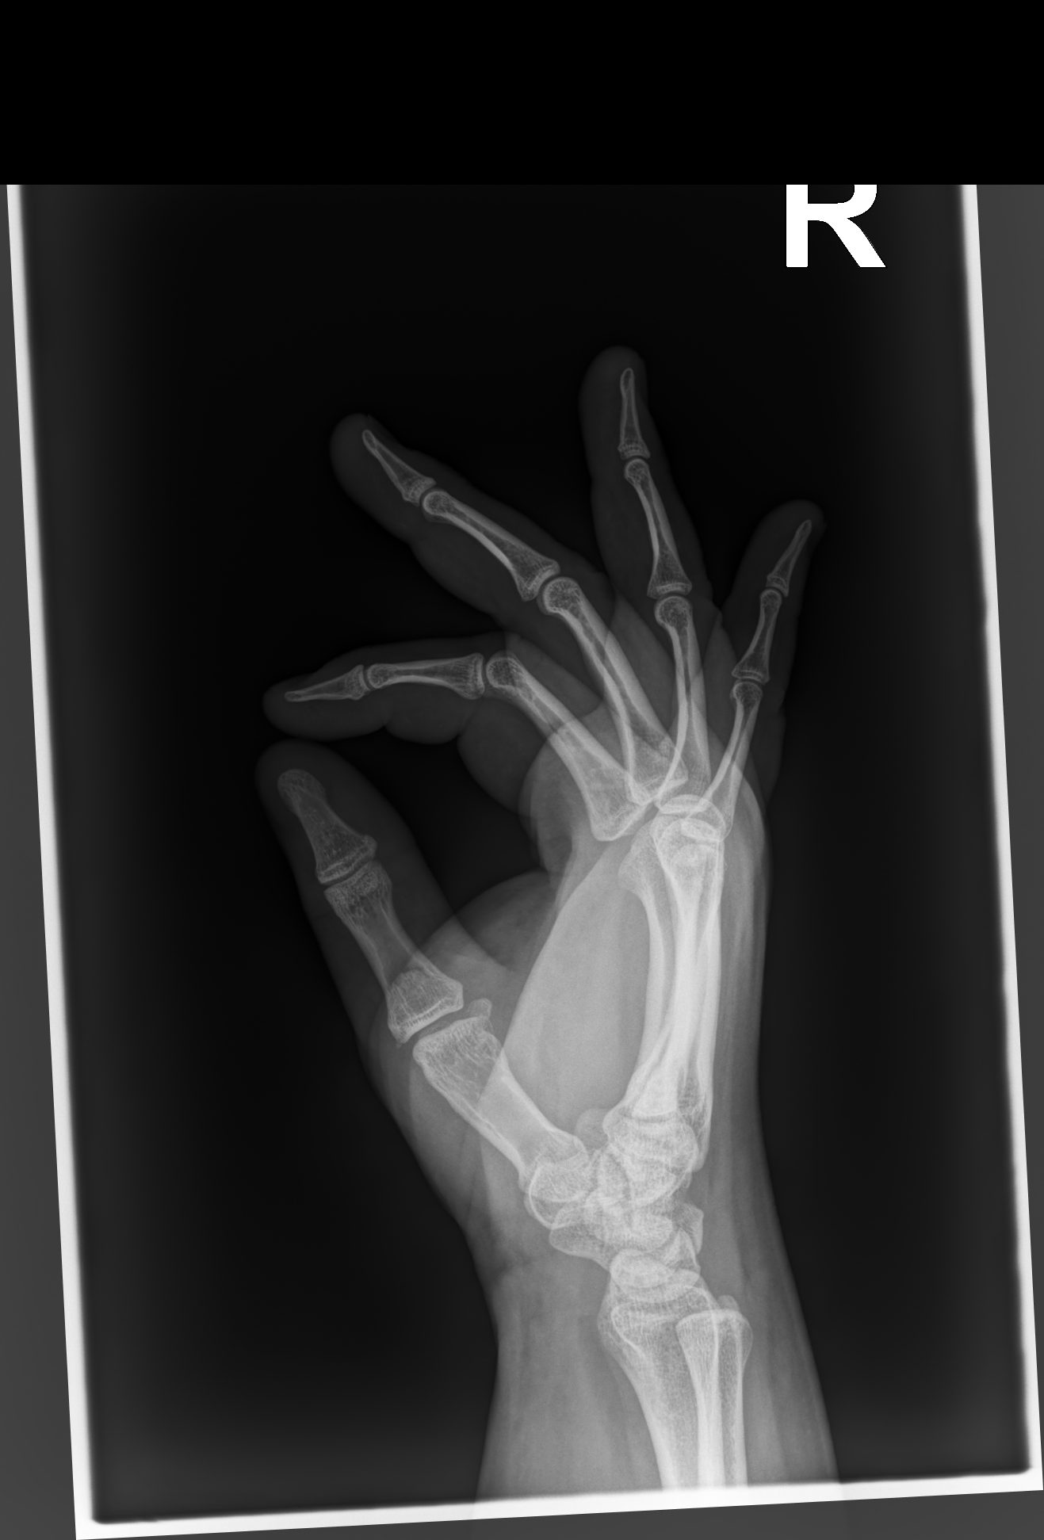

[3 of 3 positions shown; findings below may reference images not displayed]

FINDINGS: There is no evidence of fracture or dislocation. There is no
evidence of arthropathy or other focal bone abnormality. Diffuse
soft tissue edema about the hand. No subcutaneous emphysema.
IMPRESSION: 1. No fracture or dislocation of the right hand.
2. Diffuse soft tissue edema about the hand. No subcutaneous
emphysema. Please note that the absence of soft tissue gas does not
exclude deep tissue infection; recommend urgent surgical referral if
there is high clinical suspicion for deep tissue infection.
3. No radiopaque foreign body.

## 2023-05-18 DIAGNOSIS — Z801 Family history of malignant neoplasm of trachea, bronchus and lung: Secondary | ICD-10-CM | POA: Diagnosis not present

## 2023-05-18 DIAGNOSIS — J45909 Unspecified asthma, uncomplicated: Secondary | ICD-10-CM | POA: Diagnosis not present

## 2023-05-18 DIAGNOSIS — Z6837 Body mass index (BMI) 37.0-37.9, adult: Secondary | ICD-10-CM | POA: Diagnosis not present

## 2023-05-18 DIAGNOSIS — Z803 Family history of malignant neoplasm of breast: Secondary | ICD-10-CM | POA: Diagnosis not present

## 2024-01-28 ENCOUNTER — Emergency Department (HOSPITAL_BASED_OUTPATIENT_CLINIC_OR_DEPARTMENT_OTHER)
Admission: EM | Admit: 2024-01-28 | Discharge: 2024-01-29 | Disposition: A | Discharge status: Home / Self Care | Payer: 59 | Attending: Emergency Medicine | Admitting: Emergency Medicine

## 2024-01-28 ENCOUNTER — Encounter (HOSPITAL_BASED_OUTPATIENT_CLINIC_OR_DEPARTMENT_OTHER): Payer: Self-pay

## 2024-01-28 ENCOUNTER — Emergency Department (HOSPITAL_BASED_OUTPATIENT_CLINIC_OR_DEPARTMENT_OTHER): Payer: 59

## 2024-01-28 ENCOUNTER — Other Ambulatory Visit: Payer: Self-pay

## 2024-01-28 DIAGNOSIS — S51822A Laceration with foreign body of left forearm, initial encounter: Secondary | ICD-10-CM | POA: Diagnosis not present

## 2024-01-28 DIAGNOSIS — S50852A Superficial foreign body of left forearm, initial encounter: Secondary | ICD-10-CM

## 2024-01-28 DIAGNOSIS — W268XXA Contact with other sharp object(s), not elsewhere classified, initial encounter: Secondary | ICD-10-CM | POA: Diagnosis not present

## 2024-01-28 DIAGNOSIS — E669 Obesity, unspecified: Secondary | ICD-10-CM | POA: Diagnosis not present

## 2024-01-28 DIAGNOSIS — S4990XA Unspecified injury of shoulder and upper arm, unspecified arm, initial encounter: Secondary | ICD-10-CM | POA: Diagnosis not present

## 2024-01-28 NOTE — ED Triage Notes (Signed)
Pt reports he was working with a hammer and metal when a piece of the metal went into the left forearm. Pt reports he was able to get most of it out but reports he thinks some of it is still there. Pt reports pain from elbow to wrist. Pt denies any hand pain. Pt reports tetanus shot up to date.

## 2024-01-29 NOTE — Discharge Instructions (Signed)
 You were seen today with foreign body in the left arm.  There is evidence of metal.  However given size and difficulty for retrieval, this was left in place.  Monitor closely for signs and symptoms of infection.  This may work its way out on its own.

## 2024-01-29 NOTE — ED Provider Notes (Signed)
 [facility] EMERGENCY DEPARTMENT AT [facility] Provider Note   CSN: [id number] Arrival date & time: 01/28/24  2036     History  Chief Complaint  Patient presents with   Arm Injury    RUTH TULLY is a 23 y.o. male.  HPI     This is a 23 year old male who presents with concern for foreign body in the arm.  Patient reports that he was working with steel yesterday when a piece of metal broke and lodged in his left forearm.  This happened approximately 12 hours prior to arrival.  He was able to pull out a small piece of metal but thinks he may have some more still in his arm.  No active bleeding.  Tetanus is up-to-date per the patient.  He is right-handed.  Home Medications Prior to Admission medications   Medication Sig Start Date End Date Taking? Authorizing Provider  doxycycline (VIBRAMYCIN) 100 MG capsule Take 1 capsule (100 mg total) by mouth 2 (two) times daily. 03/12/22   Wallis Bamberg, PA-C  sulfamethoxazole-trimethoprim (BACTRIM DS) 800-160 MG tablet Take 1 tablet by mouth 2 (two) times daily. 12/19/18   Dettinger, Elige Radon, MD  sulfamethoxazole-trimethoprim (BACTRIM DS,SEPTRA DS) 800-160 MG tablet TAKE  (1)  TABLET TWICE A DAY. 12/20/18   Jannifer Rodney A, FNP      Allergies    Amoxicillin and Penicillins    Review of Systems   Review of Systems  Constitutional:  Negative for fever.  Skin:  Positive for wound.  All other systems reviewed and are negative.   Physical Exam Updated Vital Signs BP (!) 143/78 (BP Location: Right Arm)   Pulse 67   Temp 98 F (36.7 C) (Oral)   Resp 18   SpO2 100%  Physical Exam Vitals and nursing note reviewed.  Constitutional:      Appearance: He is well-developed. He is obese. He is not ill-appearing.  HENT:     Head: Normocephalic and atraumatic.  Eyes:     Pupils: Pupils are equal, round, and reactive to light.  Cardiovascular:     Rate and Rhythm: Normal rate and regular rhythm.  Pulmonary:     Effort: Pulmonary  effort is normal. No respiratory distress.  Abdominal:     Palpations: Abdomen is soft.  Musculoskeletal:     Cervical back: Neck supple.     Comments: Focused examination of the left forearm with 0.5 cm linear laceration of the mid forearm with scabbing noted, no adjacent erythema, no palpable foreign body, no drainage noted  Lymphadenopathy:     Cervical: No cervical adenopathy.  Skin:    General: Skin is warm and dry.  Neurological:     Mental Status: He is alert and oriented to person, place, and time.  Psychiatric:        Mood and Affect: Mood normal.     ED Results / Procedures / Treatments   Labs (all labs ordered are listed, but only abnormal results are displayed) Labs Reviewed - No data to display  EKG None  Radiology DG Forearm Left Result Date: 01/28/2024 CLINICAL DATA:  Piece of metal in forearm. EXAM: LEFT FOREARM - 2 VIEW COMPARISON:  None Available. FINDINGS: Small metallic foreign body noted within the soft tissues in the distal left forearm, near the distal shaft of the ulna. No fracture, subluxation or dislocation. IMPRESSION: Small metallic foreign body within the soft tissues adjacent to the distal shaft of the left ulna. Electronically Signed   By: Caryn Bee  Dover M.D.   On: 01/28/2024 22:07    Procedures Procedures    Medications Ordered in ED Medications - No data to display  ED Course/ Medical Decision Making/ A&P                                 Medical Decision Making Amount and/or Complexity of Data Reviewed Radiology: ordered.   This patient presents to the ED for concern of foreign body arm, this involves an extensive number of treatment options, and is a complaint that carries with it a high risk of complications and morbidity.  I considered the following differential and admission for this acute, potentially life threatening condition.  The differential diagnosis includes foreign body, simple laceration  MDM:    This is a 23 year old male  who presents with concern for foreign body in the left arm.  He is nontoxic and vital signs are reassuring.  He has a 0.5 cm scabbed over laceration at the mid forearm without a palpable foreign body.  However, x-rays do indicate likely retained metal fragment.  At this time the laceration is quite small and has already begun to scab over.  Given the size of the object noted on x-ray, I discussed with the patient that it may be quite difficult to retrieve at this point and/or retrieval may cause more damage and pain.  At this point would recommend leaving it in place.  It may work its way out.  Patient is agreeable to plan.  We discussed monitoring for signs and symptoms of infection.  (Labs, imaging, consults)  Labs: I Ordered, and personally interpreted labs.  The pertinent results include: None  Imaging Studies ordered: I ordered imaging studies including x-ray I independently visualized and interpreted imaging. I agree with the radiologist interpretation  Additional history obtained from chart review.  External records from outside source obtained and reviewed including prior evaluations  Cardiac Monitoring: The patient was not maintained on a cardiac monitor.  If on the cardiac monitor, I personally viewed and interpreted the cardiac monitored which showed an underlying rhythm of: N/A  Reevaluation: After the interventions noted above, I reevaluated the patient and found that they have :stayed the same  Social Determinants of Health:  lives independently  Disposition: Discharge  Co morbidities that complicate the patient evaluation  Past Medical History:  Diagnosis Date   Asthma      Medicines No orders of the defined types were placed in this encounter.   I have reviewed the patients home medicines and have made adjustments as needed  Problem List / ED Course: Problem List Items Addressed This Visit   None Visit Diagnoses       Foreign body in left forearm, initial  encounter    -  Primary                   Final Clinical Impression(s) / ED Diagnoses Final diagnoses:  Foreign body in left forearm, initial encounter    Rx / DC Orders ED Discharge Orders     None         Ophia Shamoon, Mayer Masker, MD 01/29/24 [phone]

## 2024-01-29 NOTE — ED Notes (Signed)
 AVS provided by edp was reviewed with pt. Pt verbalized understanding with no additional questions at this time. Pt to go home with family at bedside

## 2024-12-04 ENCOUNTER — Ambulatory Visit: Payer: Self-pay | Admitting: Nurse Practitioner

## 2024-12-04 ENCOUNTER — Ambulatory Visit (INDEPENDENT_AMBULATORY_CARE_PROVIDER_SITE_OTHER)

## 2024-12-04 ENCOUNTER — Ambulatory Visit
Admission: EM | Admit: 2024-12-04 | Discharge: 2024-12-04 | Disposition: A | Discharge status: Home / Self Care | Attending: Nurse Practitioner | Admitting: Nurse Practitioner

## 2024-12-04 DIAGNOSIS — G5621 Lesion of ulnar nerve, right upper limb: Secondary | ICD-10-CM | POA: Diagnosis not present

## 2024-12-04 DIAGNOSIS — M25521 Pain in right elbow: Secondary | ICD-10-CM

## 2024-12-04 MED ORDER — PREDNISONE 20 MG PO TABS: 40.0000 mg | ORAL_TABLET | Freq: Every day | ORAL | 0 refills | Status: AC

## 2024-12-04 NOTE — ED Provider Notes (Addendum)
 " [facility]    CSN: [id number] Arrival date & time: 12/04/24  9175      History   Chief Complaint No chief complaint on file.   HPI Scott Rubio is a 23 y.o. male.   The history is provided by the patient.   Patient presents for complaints of right upper extremity pain has been present for the past 1.5 months.  Patient states prior to Thanksgiving, he coughed and subsequently felt a sharp pain in his elbow that ran down to his right hand.  He states prior to this incident, he did hit his right elbow on a metal truck frame.  He states since that time, he has intermittent numbness that runs down from the right shoulder into the small finger and ring finger of his right hand.  He states symptoms are worse at night and in the morning.  He states symptoms do improve during the day.  He denies bruising, swelling, erythema, or decreased range of motion.  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  So far, states he has been taking over-the-counter ibuprofen for symptoms with some relief.  Past Medical History:  Diagnosis Date   Asthma     There are no active problems to display for this patient.   History reviewed. No pertinent surgical history.     Home Medications    Prior to Admission medications  Medication Sig Start Date End Date Taking? Authorizing Provider  predniSONE (DELTASONE) 20 MG tablet Take 2 tablets (40 mg total) by mouth daily with breakfast for 5 days. 12/04/24 12/09/24 Yes Leath-Warren, Etta JINNY, NP    Family History Family History  Problem Relation Age of Onset   Heart disease Father    Hypertension Father     Social History Social History[1]   Allergies   Amoxicillin and Penicillins   Review of Systems Review of Systems Per HPI  Physical Exam Triage Vital Signs ED Triage Vitals  Encounter Vitals Group     BP --      Girls Systolic BP  Percentile --      Girls Diastolic BP Percentile --      Boys Systolic BP Percentile --      Boys Diastolic BP Percentile --      Pulse Rate 12/04/24 0846 80     Resp 12/04/24 0846 18     Temp 12/04/24 0846 97.8 F (36.6 C)     Temp Source 12/04/24 0846 Oral     SpO2 12/04/24 0846 98 %     Weight --      Height --      Head Circumference --      Peak Flow --      Pain Score 12/04/24 0848 5     Pain Loc --      Pain Education --      Exclude from Growth Chart --    No data found.  Updated Vital Signs BP (!) 142/92 (BP Location: Right Arm)   Pulse 80   Temp 97.8 F (36.6 C) (Oral)   Resp 18   SpO2 98%   Visual Acuity Right Eye Distance:   Left Eye Distance:   Bilateral Distance:    Right Eye Near:   Left Eye Near:    Bilateral Near:     Physical Exam Vitals and nursing note reviewed.  HENT:  Head: Normocephalic.  Eyes:     Extraocular Movements: Extraocular movements intact.     Pupils: Pupils are equal, round, and reactive to light.  Cardiovascular:     Rate and Rhythm: Normal rate and regular rhythm.     Pulses: Normal pulses.     Heart sounds: Normal heart sounds.  Pulmonary:     Effort: Pulmonary effort is normal. No respiratory distress.     Breath sounds: Normal breath sounds. No stridor. No wheezing, rhonchi or rales.  Musculoskeletal:     Right shoulder: Normal.     Right upper arm: Normal.     Right elbow: No swelling or deformity. Normal range of motion. No tenderness.     Right forearm: Normal.     Right wrist: Normal.     Right hand: No swelling. Normal range of motion. Decreased strength. Normal capillary refill. Normal pulse.     Cervical back: Normal range of motion.  Skin:    General: Skin is warm and dry.  Neurological:     General: No focal deficit present.     Mental Status: He is alert and oriented to person, place, and time.  Psychiatric:        Mood and Affect: Mood normal.        Behavior: Behavior normal.      UC  Treatments / Results  Labs (all labs ordered are listed, but only abnormal results are displayed) Labs Reviewed - No data to display  EKG   Radiology DG Elbow Complete Right Result Date: 12/04/2024 EXAM: 3 VIEW(S) XRAY OF THE ELBOW COMPARISON: None available. CLINICAL HISTORY: elbow pain x 1 month, hit elbow a truck frame FINDINGS: BONES AND JOINTS: No acute fracture. No malalignment. SOFT TISSUES: The soft tissues are unremarkable. IMPRESSION: 1. No acute fracture or dislocation. Electronically signed by: Michaeline Blanch MD 12/04/2024 10:44 AM EST RP Workstation: [id number]    Procedures Procedures (including critical care time)  Medications Ordered in UC Medications - No data to display  Initial Impression / Assessment and Plan / UC Course  I have reviewed the triage vital signs and the nursing notes.  Pertinent labs & imaging results that were available during my care of the patient were reviewed by me and considered in my medical decision making (see chart for details).  X-ray of the right elbow is pending.  On exam, the patient has full range of motion of the right upper extremity.  He is noted to have mild decreased grip strength in the right hand.  He also complains of numbness and tingling in the right small finger and right ring finger.  Symptoms are consistent with impingement of the right ulnar nerve versus cubital tunnel syndrome.  Will treat with prednisone 40 mg for the next 5 days.  Supportive care recommendations were provided and discussed with the patient to include RICE therapy, and gentle range of motion exercises.  Discussed indications with the patient regarding follow-up.  Patient was given information for orthopedics if symptoms fail to improve.  Patient was in agreement with this plan of care and verbalizes understanding.  All questions were answered.  Patient stable for discharge.  Update 1059am: X-ray of the right elbow was negative for fracture or dislocation.   Patient sent message via MyChart regarding same with instruction to continue current treatment recommendations.  Final Clinical Impressions(s) / UC Diagnoses   Final diagnoses:  Elbow pain, right  Impingement of right ulnar nerve     Discharge Instructions  The x-ray of the right elbow is pending.  You will be contacted if the pending test results are abnormal.  You will also have access to results via MyChart. Take medication as prescribed. You may take over-the-counter Tylenol as needed for pain or discomfort. RICE therapy, rest, ice, compression, and elevation.  Apply ice for 20 minutes, remove for 1 hour, repeat as needed while symptoms persist. Recommend gentle range of motion exercises and exercising the hand is much as possible while symptoms persist. As discussed, if your symptoms fail to improve, recommend follow-up with orthopedics for further evaluation. Follow-up as needed.     ED Prescriptions     Medication Sig Dispense Auth. Provider   predniSONE (DELTASONE) 20 MG tablet Take 2 tablets (40 mg total) by mouth daily with breakfast for 5 days. 10 tablet Leath-Warren, Etta PARAS, NP      PDMP not reviewed this encounter.    Gilmer Etta PARAS, NP 12/04/24 1059     [1]  Social History Tobacco Use   Smoking status: Never    Passive exposure: Never   Smokeless tobacco: Never  Vaping Use   Vaping status: Never Used  Substance Use Topics   Alcohol use: No   Drug use: No     Gilmer Etta PARAS, NP 12/04/24 1100  "

## 2024-12-04 NOTE — ED Triage Notes (Signed)
 Pt reports right elbow pain, states back in NOV, he coughed and it sent a shooting pain down into the elbow, pain and numbness in the elbow and shoulder is now back as well. Does work as heavy Theatre stage manager.

## 2024-12-04 NOTE — Discharge Instructions (Addendum)
 The x-ray of the right elbow is pending.  You will be contacted if the pending test results are abnormal.  You will also have access to results via MyChart. Take medication as prescribed. You may take over-the-counter Tylenol as needed for pain or discomfort. RICE therapy, rest, ice, compression, and elevation.  Apply ice for 20 minutes, remove for 1 hour, repeat as needed while symptoms persist. Recommend gentle range of motion exercises and exercising the hand is much as possible while symptoms persist. As discussed, if your symptoms fail to improve, recommend follow-up with orthopedics for further evaluation. Follow-up as needed.
